# Patient Record
Sex: Female | Born: 1985 | Race: White | Hispanic: No | Marital: Single | Smoking: Former smoker
Health system: Southern US, Community
[De-identification: ages and names within clinical notes are randomized; demographics above are authoritative.]

## PROBLEM LIST (undated history)

## (undated) DIAGNOSIS — D649 Anemia, unspecified: Secondary | ICD-10-CM

## (undated) DIAGNOSIS — F419 Anxiety disorder, unspecified: Secondary | ICD-10-CM

## (undated) HISTORY — DX: Anemia, unspecified: D64.9

## (undated) HISTORY — PX: OTHER SURGICAL HISTORY: SHX169

---

## 2005-10-16 ENCOUNTER — Other Ambulatory Visit: Admission: RE | Admit: 2005-10-16 | Discharge: 2005-10-16 | Payer: Self-pay | Admitting: Gynecology

## 2007-04-23 ENCOUNTER — Emergency Department (HOSPITAL_COMMUNITY): Admission: EM | Admit: 2007-04-23 | Discharge: 2007-04-23 | Payer: Self-pay | Admitting: Family Medicine

## 2009-03-16 ENCOUNTER — Ambulatory Visit: Payer: Self-pay | Admitting: Diagnostic Radiology

## 2009-03-16 ENCOUNTER — Encounter: Payer: Self-pay | Admitting: Emergency Medicine

## 2009-03-17 ENCOUNTER — Ambulatory Visit: Payer: Self-pay | Admitting: Gastroenterology

## 2009-03-17 ENCOUNTER — Inpatient Hospital Stay (HOSPITAL_COMMUNITY): Admission: EM | Admit: 2009-03-17 | Discharge: 2009-03-27 | Payer: Self-pay | Admitting: Internal Medicine

## 2009-03-17 ENCOUNTER — Encounter: Payer: Self-pay | Admitting: Internal Medicine

## 2009-03-19 ENCOUNTER — Encounter: Payer: Self-pay | Admitting: Gastroenterology

## 2009-03-22 ENCOUNTER — Encounter (INDEPENDENT_AMBULATORY_CARE_PROVIDER_SITE_OTHER): Payer: Self-pay | Admitting: Surgery

## 2009-08-06 ENCOUNTER — Other Ambulatory Visit: Admission: RE | Admit: 2009-08-06 | Discharge: 2009-08-06 | Payer: Self-pay | Admitting: Gynecology

## 2009-08-06 ENCOUNTER — Ambulatory Visit: Payer: Self-pay | Admitting: Women's Health

## 2009-09-15 ENCOUNTER — Emergency Department (HOSPITAL_BASED_OUTPATIENT_CLINIC_OR_DEPARTMENT_OTHER): Admission: EM | Admit: 2009-09-15 | Discharge: 2009-09-15 | Payer: Self-pay | Admitting: Emergency Medicine

## 2009-09-16 ENCOUNTER — Observation Stay (HOSPITAL_COMMUNITY): Admission: EM | Admit: 2009-09-16 | Discharge: 2009-09-17 | Payer: Self-pay | Admitting: Emergency Medicine

## 2010-08-12 ENCOUNTER — Encounter (INDEPENDENT_AMBULATORY_CARE_PROVIDER_SITE_OTHER): Payer: 59 | Admitting: Women's Health

## 2010-08-12 ENCOUNTER — Other Ambulatory Visit (HOSPITAL_COMMUNITY)
Admission: RE | Admit: 2010-08-12 | Discharge: 2010-08-12 | Disposition: A | Discharge status: Home / Self Care | Payer: 59 | Source: Ambulatory Visit | Attending: Gynecology | Admitting: Gynecology

## 2010-08-12 ENCOUNTER — Other Ambulatory Visit: Payer: Self-pay | Admitting: Women's Health

## 2010-08-12 DIAGNOSIS — R82998 Other abnormal findings in urine: Secondary | ICD-10-CM

## 2010-08-12 DIAGNOSIS — B373 Candidiasis of vulva and vagina: Secondary | ICD-10-CM

## 2010-08-12 DIAGNOSIS — Z113 Encounter for screening for infections with a predominantly sexual mode of transmission: Secondary | ICD-10-CM | POA: Insufficient documentation

## 2010-08-12 DIAGNOSIS — Z01419 Encounter for gynecological examination (general) (routine) without abnormal findings: Secondary | ICD-10-CM

## 2010-08-12 DIAGNOSIS — Z124 Encounter for screening for malignant neoplasm of cervix: Secondary | ICD-10-CM | POA: Insufficient documentation

## 2010-08-13 ENCOUNTER — Emergency Department (HOSPITAL_COMMUNITY)
Admission: EM | Admit: 2010-08-13 | Discharge: 2010-08-14 | Disposition: A | Discharge status: Home / Self Care | Payer: 59 | Attending: Emergency Medicine | Admitting: Emergency Medicine

## 2010-08-13 ENCOUNTER — Emergency Department (HOSPITAL_COMMUNITY): Payer: 59

## 2010-08-13 DIAGNOSIS — R10819 Abdominal tenderness, unspecified site: Secondary | ICD-10-CM | POA: Insufficient documentation

## 2010-08-13 DIAGNOSIS — R141 Gas pain: Secondary | ICD-10-CM | POA: Insufficient documentation

## 2010-08-13 DIAGNOSIS — R109 Unspecified abdominal pain: Secondary | ICD-10-CM | POA: Insufficient documentation

## 2010-08-13 DIAGNOSIS — R110 Nausea: Secondary | ICD-10-CM | POA: Insufficient documentation

## 2010-08-13 DIAGNOSIS — R142 Eructation: Secondary | ICD-10-CM | POA: Insufficient documentation

## 2010-08-13 DIAGNOSIS — K5909 Other constipation: Secondary | ICD-10-CM | POA: Insufficient documentation

## 2010-08-13 LAB — URINALYSIS, ROUTINE W REFLEX MICROSCOPIC
Protein, ur: NEGATIVE mg/dL
Urobilinogen, UA: 1 mg/dL (ref 0.0–1.0)

## 2010-08-13 LAB — URINE MICROSCOPIC-ADD ON

## 2010-08-13 LAB — COMPREHENSIVE METABOLIC PANEL
AST: 15 U/L (ref 0–37)
Albumin: 3.9 g/dL (ref 3.5–5.2)
CO2: 25 mEq/L (ref 19–32)
Creatinine, Ser: 0.69 mg/dL (ref 0.4–1.2)
GFR calc Af Amer: >60 mL/min (ref >60)
Glucose, Bld: 96 mg/dL (ref 70–99)
Sodium: 139 mEq/L (ref 135–145)
Total Bilirubin: 0.5 mg/dL (ref 0.3–1.2)
Total Protein: 6.7 g/dL (ref 6.0–8.3)

## 2010-08-13 LAB — POCT PREGNANCY, URINE: Preg Test, Ur: NEGATIVE

## 2010-08-13 LAB — CBC
HCT: 34 % — ABNORMAL LOW (ref 36.0–46.0)
MCV: 89.5 fL (ref 78.0–100.0)
RDW: 13.1 % (ref 11.5–15.5)
WBC: 8.6 10*3/uL (ref 4.0–10.5)

## 2010-08-13 LAB — DIFFERENTIAL
Basophils Absolute: 0.1 10*3/uL (ref 0.0–0.1)
Eosinophils Relative: 1 % (ref 0–5)
Monocytes Absolute: 0.9 10*3/uL (ref 0.1–1.0)
Monocytes Relative: 11 % (ref 3–12)
Neutrophils Relative %: 48 % (ref 43–77)

## 2010-08-13 LAB — LACTIC ACID, PLASMA: Lactic Acid, Venous: 0.6 mmol/L (ref 0.5–2.2)

## 2010-08-14 ENCOUNTER — Encounter (INDEPENDENT_AMBULATORY_CARE_PROVIDER_SITE_OTHER): Payer: Self-pay | Admitting: *Deleted

## 2010-08-22 NOTE — Letter (Signed)
Summary: New Patient letter  [facility]  [address]   [place], [place] [address]   Phone: [phone]  Fax: [phone]       08/14/2010 MRN: [id number]  [facility] [address] [place], [place]  [address]  [place]  Dear Ms. [facility],  Welcome to the Gastroenterology Division at [facility].    You are scheduled to see Dr.  Arlyce Dice on 09/23/2010 at 3:00 on the 3rd floor at [facility], [address]  We ask that you try to arrive at our office 15 minutes prior to your appointment time to allow for check-in.  We would like you to complete the enclosed self-administered evaluation form prior to your visit and bring it with you on the day of your appointment.  We will review it with you.  Also, please bring a complete list of all your medications or, if you prefer, bring the medication bottles and we will list them.  Please bring your insurance card so that we may make a copy of it.  If your insurance requires a referral to see a specialist, please bring your referral form from your primary care physician.  Co-payments are due at the time of your visit and may be paid by cash, check or credit card.     Your office visit will consist of a consult with your physician (includes a physical exam), any laboratory testing he/she may order, scheduling of any necessary diagnostic testing (e.g. x-ray, ultrasound, CT-scan), and scheduling of a procedure (e.g. Endoscopy, Colonoscopy) if required.  Please allow enough time on your schedule to allow for any/all of these possibilities.    If you cannot keep your appointment, please call [phone] to cancel or reschedule prior to your appointment date.  This allows [place] the opportunity to schedule an appointment for another patient in need of care.  If you do not cancel or reschedule by 5 p.m. the business day prior to your appointment date, you will be charged a $50.00 late cancellation/no-show fee.    Thank you for  choosing [facility] for your medical needs.  We appreciate the opportunity to care for you.  Please visit [place] at our website  to learn more about our practice.                     Sincerely,                                                             The Gastroenterology Division

## 2010-09-04 LAB — CBC
HCT: 28.1 % — ABNORMAL LOW (ref 36.0–46.0)
HCT: 29.5 % — ABNORMAL LOW (ref 36.0–46.0)
Hemoglobin: 10.2 g/dL — ABNORMAL LOW (ref 12.0–15.0)
Hemoglobin: 9.4 g/dL — ABNORMAL LOW (ref 12.0–15.0)
MCV: 90.2 fL (ref 78.0–100.0)
MCV: 90.4 fL (ref 78.0–100.0)
Platelets: 128 10*3/uL — ABNORMAL LOW (ref 150–400)
Platelets: 128 10*3/uL — ABNORMAL LOW (ref 150–400)
RBC: 3.13 MIL/uL — ABNORMAL LOW (ref 3.87–5.11)
RBC: 3.26 MIL/uL — ABNORMAL LOW (ref 3.87–5.11)
WBC: 14.5 10*3/uL — ABNORMAL HIGH (ref 4.0–10.5)
WBC: 7.5 10*3/uL (ref 4.0–10.5)
WBC: 8.1 10*3/uL (ref 4.0–10.5)

## 2010-09-04 LAB — DIFFERENTIAL
Basophils Absolute: 0 10*3/uL (ref 0.0–0.1)
Basophils Relative: 0 % (ref 0–1)
Basophils Relative: 0 % (ref 0–1)
Eosinophils Absolute: 0 10*3/uL (ref 0.0–0.7)
Eosinophils Relative: 0 % (ref 0–5)
Lymphs Abs: 0.3 10*3/uL — ABNORMAL LOW (ref 0.7–4.0)
Monocytes Absolute: 0.2 10*3/uL (ref 0.1–1.0)
Monocytes Relative: 2 % — ABNORMAL LOW (ref 3–12)
Monocytes Relative: 4 % (ref 3–12)
Neutro Abs: 7.8 10*3/uL — ABNORMAL HIGH (ref 1.7–7.7)
Neutrophils Relative %: 96 % — ABNORMAL HIGH (ref 43–77)

## 2010-09-04 LAB — BASIC METABOLIC PANEL
BUN: 11 mg/dL (ref 6–23)
BUN: 19 mg/dL (ref 6–23)
Calcium: 7.2 mg/dL — ABNORMAL LOW (ref 8.4–10.5)
Chloride: 105 mEq/L (ref 96–112)
Chloride: 112 mEq/L (ref 96–112)
Creatinine, Ser: 0.61 mg/dL (ref 0.4–1.2)
GFR calc Af Amer: >60 mL/min (ref >60)
GFR calc Af Amer: >60 mL/min (ref >60)
GFR calc non Af Amer: >60 mL/min (ref >60)
GFR calc non Af Amer: >60 mL/min (ref >60)
GFR calc non Af Amer: >60 mL/min (ref >60)
Potassium: 3.1 mEq/L — ABNORMAL LOW (ref 3.5–5.1)
Sodium: 131 mEq/L — ABNORMAL LOW (ref 135–145)
Sodium: 137 mEq/L (ref 135–145)

## 2010-09-04 LAB — COMPREHENSIVE METABOLIC PANEL
ALT: 41 U/L — ABNORMAL HIGH (ref 0–35)
Albumin: 2.8 g/dL — ABNORMAL LOW (ref 3.5–5.2)
Alkaline Phosphatase: 90 U/L (ref 39–117)
BUN: 27 mg/dL — ABNORMAL HIGH (ref 6–23)
Chloride: 109 mEq/L (ref 96–112)
Glucose, Bld: 110 mg/dL — ABNORMAL HIGH (ref 70–99)
Potassium: 3.2 mEq/L — ABNORMAL LOW (ref 3.5–5.1)
Sodium: 141 mEq/L (ref 135–145)
Total Bilirubin: 0.9 mg/dL (ref 0.3–1.2)
Total Protein: 6 g/dL (ref 6.0–8.3)

## 2010-09-04 LAB — URINALYSIS, ROUTINE W REFLEX MICROSCOPIC
Glucose, UA: NEGATIVE mg/dL
Ketones, ur: 15 mg/dL — AB
Nitrite: NEGATIVE
Protein, ur: 100 mg/dL — AB
Protein, ur: 30 mg/dL — AB
Specific Gravity, Urine: 1.023 (ref 1.005–1.030)
Urobilinogen, UA: 2 mg/dL — ABNORMAL HIGH (ref 0.0–1.0)
Urobilinogen, UA: 4 mg/dL — ABNORMAL HIGH (ref 0.0–1.0)

## 2010-09-04 LAB — STREP A DNA PROBE: Group A Strep Probe: NEGATIVE

## 2010-09-04 LAB — MRSA PCR SCREENING: MRSA by PCR: NEGATIVE

## 2010-09-04 LAB — HEPATIC FUNCTION PANEL
Alkaline Phosphatase: 105 U/L (ref 39–117)
Bilirubin, Direct: 0.5 mg/dL — ABNORMAL HIGH (ref 0.0–0.3)
Indirect Bilirubin: 0.4 mg/dL (ref 0.3–0.9)
Total Bilirubin: 0.9 mg/dL (ref 0.3–1.2)
Total Protein: 6.3 g/dL (ref 6.0–8.3)

## 2010-09-04 LAB — STOOL CULTURE

## 2010-09-04 LAB — AMYLASE: Amylase: 18 U/L (ref 0–105)

## 2010-09-04 LAB — CULTURE, BLOOD (ROUTINE X 2): Culture: NO GROWTH

## 2010-09-04 LAB — KETONES, QUALITATIVE

## 2010-09-04 LAB — LACTIC ACID, PLASMA: Lactic Acid, Venous: 1.4 mmol/L (ref 0.5–2.2)

## 2010-09-04 LAB — CARDIAC PANEL(CRET KIN+CKTOT+MB+TROPI)
CK, MB: 0.4 ng/mL (ref 0.3–4.0)
Total CK: 84 U/L (ref 7–177)

## 2010-09-04 LAB — URINE CULTURE

## 2010-09-04 LAB — URINE MICROSCOPIC-ADD ON

## 2010-09-04 LAB — EPSTEIN-BARR VIRUS VCA ANTIBODY PANEL: EBV NA IgG: 0.46 {ISR}

## 2010-09-19 LAB — CBC
HCT: 30.3 % — ABNORMAL LOW (ref 36.0–46.0)
HCT: 31.2 % — ABNORMAL LOW (ref 36.0–46.0)
HCT: 31.9 % — ABNORMAL LOW (ref 36.0–46.0)
Hemoglobin: 10.3 g/dL — ABNORMAL LOW (ref 12.0–15.0)
MCV: 90.7 fL (ref 78.0–100.0)
MCV: 91.1 fL (ref 78.0–100.0)
Platelets: 177 10*3/uL (ref 150–400)
Platelets: 189 10*3/uL (ref 150–400)
Platelets: 198 10*3/uL (ref 150–400)
Platelets: 218 10*3/uL (ref 150–400)
RBC: 3.42 MIL/uL — ABNORMAL LOW (ref 3.87–5.11)
RBC: 3.81 MIL/uL — ABNORMAL LOW (ref 3.87–5.11)
RDW: 13.7 % (ref 11.5–15.5)
RDW: 14.4 % (ref 11.5–15.5)
WBC: 10.9 10*3/uL — ABNORMAL HIGH (ref 4.0–10.5)
WBC: 6.6 10*3/uL (ref 4.0–10.5)
WBC: 7 10*3/uL (ref 4.0–10.5)
WBC: 8.1 10*3/uL (ref 4.0–10.5)
WBC: 8.4 10*3/uL (ref 4.0–10.5)

## 2010-09-19 LAB — URINALYSIS, ROUTINE W REFLEX MICROSCOPIC
Glucose, UA: NEGATIVE mg/dL
Specific Gravity, Urine: 1.012 (ref 1.005–1.030)
pH: 6 (ref 5.0–8.0)

## 2010-09-19 LAB — BASIC METABOLIC PANEL
BUN: 2 mg/dL — ABNORMAL LOW (ref 6–23)
BUN: 3 mg/dL — ABNORMAL LOW (ref 6–23)
BUN: 3 mg/dL — ABNORMAL LOW (ref 6–23)
BUN: 7 mg/dL (ref 6–23)
BUN: <1 mg/dL — ABNORMAL LOW (ref 6–23)
CO2: 22 mEq/L (ref 19–32)
CO2: 26 mEq/L (ref 19–32)
Calcium: 8.5 mg/dL (ref 8.4–10.5)
Calcium: 8.9 mg/dL (ref 8.4–10.5)
Chloride: 102 mEq/L (ref 96–112)
Chloride: 103 mEq/L (ref 96–112)
Chloride: 107 mEq/L (ref 96–112)
Creatinine, Ser: 0.5 mg/dL (ref 0.4–1.2)
Creatinine, Ser: 0.63 mg/dL (ref 0.4–1.2)
Creatinine, Ser: 0.63 mg/dL (ref 0.4–1.2)
Creatinine, Ser: 0.69 mg/dL (ref 0.4–1.2)
Creatinine, Ser: 0.7 mg/dL (ref 0.4–1.2)
GFR calc Af Amer: >60 mL/min (ref >60)
GFR calc Af Amer: >60 mL/min (ref >60)
GFR calc non Af Amer: >60 mL/min (ref >60)
GFR calc non Af Amer: >60 mL/min (ref >60)
GFR calc non Af Amer: >60 mL/min (ref >60)
GFR calc non Af Amer: >60 mL/min (ref >60)
Glucose, Bld: 206 mg/dL — ABNORMAL HIGH (ref 70–99)
Glucose, Bld: 67 mg/dL — ABNORMAL LOW (ref 70–99)
Glucose, Bld: 91 mg/dL (ref 70–99)
Potassium: 3.4 mEq/L — ABNORMAL LOW (ref 3.5–5.1)
Potassium: 3.8 mEq/L (ref 3.5–5.1)
Potassium: 4.1 mEq/L (ref 3.5–5.1)
Sodium: 137 mEq/L (ref 135–145)
Sodium: 137 mEq/L (ref 135–145)

## 2010-09-19 LAB — COMPREHENSIVE METABOLIC PANEL
ALT: 12 U/L (ref 0–35)
Albumin: 3.2 g/dL — ABNORMAL LOW (ref 3.5–5.2)
Alkaline Phosphatase: 36 U/L — ABNORMAL LOW (ref 39–117)
BUN: 6 mg/dL (ref 6–23)
Chloride: 107 mEq/L (ref 96–112)
Potassium: 3.7 mEq/L (ref 3.5–5.1)
Sodium: 136 mEq/L (ref 135–145)
Total Bilirubin: 0.7 mg/dL (ref 0.3–1.2)
Total Protein: 5.7 g/dL — ABNORMAL LOW (ref 6.0–8.3)

## 2010-09-19 LAB — DIFFERENTIAL
Eosinophils Absolute: 0.2 10*3/uL (ref 0.0–0.7)
Lymphocytes Relative: 32 % (ref 12–46)
Lymphs Abs: 2.6 10*3/uL (ref 0.7–4.0)
Monocytes Relative: 9 % (ref 3–12)
Neutro Abs: 4.5 10*3/uL (ref 1.7–7.7)
Neutrophils Relative %: 55 % (ref 43–77)

## 2010-09-19 LAB — URINE MICROSCOPIC-ADD ON

## 2010-09-23 ENCOUNTER — Encounter: Payer: Self-pay | Admitting: Gastroenterology

## 2010-09-23 ENCOUNTER — Ambulatory Visit (INDEPENDENT_AMBULATORY_CARE_PROVIDER_SITE_OTHER): Payer: 59 | Admitting: Gastroenterology

## 2010-09-23 DIAGNOSIS — I8390 Asymptomatic varicose veins of unspecified lower extremity: Secondary | ICD-10-CM | POA: Insufficient documentation

## 2010-09-23 DIAGNOSIS — K5900 Constipation, unspecified: Secondary | ICD-10-CM

## 2010-09-23 MED ORDER — PEG-KCL-NACL-NASULF-NA ASC-C 100 G PO SOLR: 1.0000 | Freq: Once | ORAL | Status: AC

## 2010-09-23 NOTE — Assessment & Plan Note (Signed)
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  Indications #1 referred to vascular surgery for further evaluation

## 2010-09-23 NOTE — Progress Notes (Signed)
History of Present Illness:  Lindsay Duran is a 25 year old white female referred from the ER following evaluation for abdominal pain and constipation. In October, 2010 she presented with a sigmoid volvulus. This was resected. 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 She has done well until the last month when she developed constipation. With worsening constipation she developed severe lower bowel pain and bloating. With bowel stimulants she is able to move her bowels and feels that she requires stimulants for a bowel movement. There is no history of melena or hematochezia.  Constipation continues.    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 All other review of systems were otherwise negative.    Current Medications, Allergies, Past Medical History, Past Surgical History, Family History and Social History were reviewed in [employer] electronic medical record  Vital signs were reviewed in today's medical record. Physical Exam: General: Well developed , well nourished, no acute distress Head: Normocephalic and atraumatic Eyes:  sclerae anicteric, EOMI Ears: Normal auditory acuity Mouth: No deformity or lesions Lungs: Clear throughout to auscultation Heart: Regular rate and rhythm; no murmurs, rubs or bruits Abdomen: Soft,non distended. No masses, hepatosplenomegaly or hernias noted. Normal Bowel sounds. There is mild tenderness in the right upper quadrant Rectal:deferred Musculoskeletal: Symmetrical with no gross deformities  Pulses:  Normal pulses noted Extremities: No clubbing, cyanosis, edema or deformities varicose veins are noted on each lower extremity. There is a prominent varicose vein extending to the thigh and the right lower extremity Neurological: Alert oriented x 4, grossly  nonfocal Psychological:  Alert and cooperative. Normal mood and affect

## 2010-09-23 NOTE — Assessment & Plan Note (Addendum)
Constipation is most likely functional. Congenital abnormalities of the colon, including strictures and postoperative stricture should be ruled out.  Recommendations #1 colonoscopy  Risks, alternatives, and complications of the procedure, including bleeding, perforation, and possible need for surgery, were explained to the patient.  Patient's questions were answered.

## 2010-09-23 NOTE — Patient Instructions (Signed)
Your Colonoscopy is scheduled on 09/24/2010 at 11am [facility] office will look at your records and schedule you an appointment there office number is [phone] in case you need to contact them  Colonoscopy A colonoscopy is an exam to evaluate your entire colon. In this exam, your colon is cleansed. A long fiberoptic tube is inserted through your rectum and into your colon. The fiberoptic scope (endoscope) is a long bundle of enclosed and very flexible fibers. These fibers transmit light to the area examined and send images from that area to your caregiver. Discomfort is usually minimal. You may be given a drug to help you sleep (sedative) during or prior to the procedure. This exam helps to detect lumps (tumors), polyps, inflammation, and areas of bleeding. Your caregiver may also take a small piece of tissue (biopsy) that will be examined under a microscope. BEFORE THE PROCEDURE  A clear liquid diet may be required for 2 days before the exam.   Liquid injections (enemas) or laxatives may be required.   A large amount of electrolyte solution may be given to you to drink over a short period of time. This solution is used to clean out your colon.   You should be present 1 prior to your procedure or as directed by your caregiver.   Check in at the admissions desk to fill out necessary forms if not preregistered. There will be consent forms to sign prior to the procedure. If accompanied by friends or family, there is a waiting area for them while you are having your procedure.  LET YOUR CAREGIVER KNOW ABOUT:  Allergies to food or medicine.  Medicines taken, including vitamins, herbs, eyedrops, over-the-counter medicines, and creams.   Use of steroids (by mouth or creams).   Previous problems with anesthetics or numbing medicines.   History of bleeding problems or blood clots.  Previous surgery.   Other health problems, including diabetes and kidney problems.   Possibility of pregnancy, if  this applies.   AFTER THE PROCEDURE  If you received a sedative and/or pain medicine, you will need to arrange for someone to drive you home.   Occasionally, there is a little blood passed with the first bowel movement. DO NOT be concerned.  HOME CARE INSTRUCTIONS  It is not unusual to pass moderate amounts of gas and experience mild abdominal cramping following the procedure. This is due to air being used to inflate your colon during the exam. Walking or a warm pack on your belly (abdomen) may help.   You may resume all normal meals and activities after sedatives and medicines have worn off.   Only take over-the-counter or prescription medicines for pain, discomfort, or fever as directed by your caregiver. DO NOT use aspirin or blood thinners if a biopsy was taken. Consult your caregiver for medicine usage if biopsies were taken.  FINDING OUT THE RESULTS OF YOUR TEST Not all test results are available during your visit. If your test results are not back during the visit, make an appointment with your caregiver to find out the results. Do not assume everything is normal if you have not heard from your caregiver or the medical facility. It is important for you to follow up on all of your test results. SEEK IMMEDIATE MEDICAL CARE IF:  You pass large blood clots or fill a toilet with blood following the procedure. This may also occur 10 to 14 days following the procedure. This is more likely if a biopsy was taken.  You develop abdominal pain that keeps getting worse and cannot be relieved with medicine.  Document Released: 05/30/2000 Document Re-Released: 08/27/2009 [facility] Patient Information 2011 [place], [place].

## 2010-09-24 ENCOUNTER — Encounter: Payer: Self-pay | Admitting: *Deleted

## 2010-09-24 ENCOUNTER — Encounter: Payer: Self-pay | Admitting: Gastroenterology

## 2010-09-24 ENCOUNTER — Ambulatory Visit: Payer: 59 | Admitting: Gastroenterology

## 2010-09-24 VITALS — Temp 98.7°F | Ht 69.0 in | Wt 170.0 lb

## 2010-09-24 MED ORDER — DISPOSABLE ENEMA 19-7 GM/118ML RE ENEM: 1.0000 | ENEMA | Freq: Once | RECTAL | Status: DC

## 2010-09-24 MED ORDER — BISACODYL 5 MG PO TBEC: DELAYED_RELEASE_TABLET | ORAL | Status: DC

## 2010-09-24 MED ORDER — METOCLOPRAMIDE HCL 10 MG PO TABS: ORAL_TABLET | ORAL | Status: DC

## 2010-09-24 MED ORDER — SODIUM CHLORIDE 0.9 % IV SOLN: 500.0000 mL | INTRAVENOUS | Status: DC

## 2010-09-24 MED ORDER — DISPOSABLE ENEMA 19-7 GM/118ML RE ENEM: 1.0000 | ENEMA | Freq: Once | RECTAL | Status: AC

## 2010-09-24 MED ORDER — POLYETHYLENE GLYCOL 3350 17 GM/SCOOP PO POWD: ORAL | Status: DC

## 2010-09-24 NOTE — Progress Notes (Signed)
Not ment to send to pharmacy

## 2010-09-24 NOTE — Progress Notes (Signed)
Pt given fleets enema X 2.  Results continue to be solid brown stool.  Pt. To be rescheduled per Dr. Arlyce Dice.  Pt. Rescheduled for 10/01/10 at 8. Instructions reviewed with pt.

## 2010-09-24 NOTE — Patient Instructions (Signed)
Return on 10/01/10 at 7:30 for colonoscopy.

## 2010-09-26 ENCOUNTER — Telehealth: Payer: Self-pay | Admitting: Gastroenterology

## 2010-09-26 NOTE — Telephone Encounter (Signed)
FYI DR Arlyce Dice

## 2010-09-30 ENCOUNTER — Telehealth: Payer: Self-pay | Admitting: Gastroenterology

## 2010-09-30 NOTE — Telephone Encounter (Signed)
yes

## 2010-10-01 ENCOUNTER — Other Ambulatory Visit: Payer: 59 | Admitting: Gastroenterology

## 2010-10-16 ENCOUNTER — Encounter: Payer: 59 | Admitting: Vascular Surgery

## 2010-10-31 ENCOUNTER — Ambulatory Visit (AMBULATORY_SURGERY_CENTER): Payer: 59 | Admitting: Gastroenterology

## 2010-10-31 ENCOUNTER — Encounter: Payer: Self-pay | Admitting: Gastroenterology

## 2010-10-31 VITALS — BP 105/60 | HR 60 | Temp 98.9°F | Resp 18 | Ht 69.0 in | Wt 165.0 lb

## 2010-10-31 DIAGNOSIS — K5900 Constipation, unspecified: Secondary | ICD-10-CM

## 2010-10-31 DIAGNOSIS — I8390 Asymptomatic varicose veins of unspecified lower extremity: Secondary | ICD-10-CM

## 2010-10-31 MED ORDER — LUBIPROSTONE 24 MCG PO CAPS: 24.0000 ug | ORAL_CAPSULE | Freq: Two times a day (BID) | ORAL | Status: AC

## 2010-10-31 MED ORDER — SODIUM CHLORIDE 0.9 % IV SOLN: 500.0000 mL | INTRAVENOUS | Status: AC

## 2010-10-31 NOTE — Patient Instructions (Signed)
Please read the handouts given to you by your recovery room nurse.    Please resume your routine medicines, and Dr. Arlyce Dice has ordered  A trial of amitiza for you.  Take it as ordered and he wants to see you in the office in 1 month.  Please call tomorrow for that appointment as his slots are booked very quickly.  Thank-you.

## 2010-11-01 ENCOUNTER — Telehealth: Payer: Self-pay

## 2010-11-01 NOTE — Telephone Encounter (Signed)
No answer. No ID on answering machine. 

## 2010-11-06 ENCOUNTER — Encounter: Payer: 59 | Admitting: Vascular Surgery

## 2010-11-13 ENCOUNTER — Encounter: Payer: 59 | Admitting: Vascular Surgery

## 2010-12-11 ENCOUNTER — Encounter: Payer: Self-pay | Admitting: Gastroenterology

## 2010-12-11 ENCOUNTER — Ambulatory Visit (INDEPENDENT_AMBULATORY_CARE_PROVIDER_SITE_OTHER): Payer: 59 | Admitting: Gastroenterology

## 2010-12-11 DIAGNOSIS — K5900 Constipation, unspecified: Secondary | ICD-10-CM

## 2010-12-11 DIAGNOSIS — I8390 Asymptomatic varicose veins of unspecified lower extremity: Secondary | ICD-10-CM

## 2010-12-11 MED ORDER — LUBIPROSTONE 24 MCG PO CAPS: 24.0000 ug | ORAL_CAPSULE | Freq: Two times a day (BID) | ORAL | Status: DC

## 2010-12-11 NOTE — Patient Instructions (Signed)
We have sent in your Amitiza rx to your pharmacy today

## 2010-12-11 NOTE — Assessment & Plan Note (Signed)
Follow up with vascular surgery.

## 2010-12-11 NOTE — Assessment & Plan Note (Addendum)
Excellent response to amitiza.  Plan to continue with the same 

## 2010-12-11 NOTE — Progress Notes (Signed)
History of Present Illness:  Lindsay Duran has returned for  followup of constipation. On a regimen of amitiza  24 mcg twice a day she is doing quite well. She is moving her bowels regularly. She has no other complaints.    Review of Systems: Pertinent positive and negative review of systems were noted in the above HPI section. All other review of systems were otherwise negative.    Current Medications, Allergies, Past Medical History, Past Surgical History, Family History and Social History were reviewed in [employer] electronic medical record  Vital signs were reviewed in today's medical record. Physical Exam: General: Well developed , well nourished, no acute distress  Extremities: No clubbing, cyanosis, edema or deformities noted Neurological: Alert oriented x 4, grossly nonfocal Psychological:  Alert and cooperative. Normal mood and affect

## 2011-02-04 ENCOUNTER — Encounter: Payer: Self-pay | Admitting: Vascular Surgery

## 2011-02-10 ENCOUNTER — Ambulatory Visit (INDEPENDENT_AMBULATORY_CARE_PROVIDER_SITE_OTHER): Payer: 59 | Admitting: Vascular Surgery

## 2011-02-10 VITALS — BP 101/58 | HR 93 | Resp 20 | Ht 70.0 in | Wt 167.0 lb

## 2011-02-10 DIAGNOSIS — I83893 Varicose veins of bilateral lower extremities with other complications: Secondary | ICD-10-CM

## 2011-02-10 NOTE — Progress Notes (Signed)
Subjective:     Patient ID: Lindsay Duran, female   DOB: Dec 30, 1985, 25 y.o.   MRN: [id number]  HPI this healthy 25 year old female was referred for bilateral lower extremity varicose veins. She states that for the last several years she has been having prominent veins developed in both lower extremities right equal to left. She will have some mild swelling in the ankles as the day progresses. He denies any severe pain in these areas but they are becoming significantly more prominent. She has no history of DVT or thrombophlebitis stasis ulcers or bleeding. She does not wear elastic compression stockings. 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 I have reviewed the CT scan with contrast today. The venous return of both lower extremities is through a large left ovarian vein which communicates to the left renal vein into the inferior vena cava above this level .  Past Medical History  Diagnosis Date  . Anemia     History  Substance Use Topics  . Smoking status: Current Some Day Smoker    Types: Cigarettes  . Smokeless tobacco: Never Used   Comment: 1 cig. occasionally  . Alcohol Use: 1.2 oz/week    2 Glasses of wine per week     occasionally    Family History  Problem Relation Age of Onset  . Lung cancer Maternal Grandfather   . Colon cancer Maternal Grandmother   . Prostate cancer Paternal Grandfather     No Known Allergies  Current outpatient prescriptions:lubiprostone (AMITIZA) 24 MCG capsule, Take 1 capsule (24 mcg total) by mouth 2 (two) times daily with a meal., Disp: 60 capsule, Rfl: 3 Current facility-administered medications:0.9 %  sodium chloride infusion, 500 mL, Intravenous, Continuous, Louis Meckel, MD  BP 101/58  Pulse 93  Resp 20  Ht 5\' 10"  (1.778 m)  Wt 167 lb (75.751 kg)  BMI 23.96 kg/m2  Body mass index is 23.96 kg/(m^2).          Review of Systems She  denies any chest pain dyspnea on exertion PND orthopnea lower extremity symptoms or any other systemic symptoms and a complete review of systems. She has done well following her surgery for sigmoid volvulus which required a colon resection.    Objective:   Physical Exam blood pressure 101/58 heart rate 93 respirations 20 General she is a healthy-appearing female in no apparent stress alert and oriented x3 she is in no apparent stress Chest clear to auscultation no rhonchi or wheezing Cardiovascular exam is regular rhythm no murmurs carotid pulses are 3+ with no audible bruits Abdomen is soft nontender with no masses Lower extremity exam reveals 3+ femoral popliteal posterior tibial and dorsalis pedis pulses palpable. There are bulging varicosities in both legs and the great saphenous system in the anterior thigh and medial calf area extending down to the midcalf region. There is minimal edema distally. No hyperpigmentation or ulceration is noted and no spider veins are present. 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    Plan:    schedule her for bilateral lower extremity reflux study and return to see me and discuss this further at that time .

## 2011-02-10 NOTE — Progress Notes (Signed)
New bilat. vv patient.

## 2011-04-07 ENCOUNTER — Encounter: Payer: Self-pay | Admitting: Vascular Surgery

## 2011-04-08 ENCOUNTER — Ambulatory Visit (INDEPENDENT_AMBULATORY_CARE_PROVIDER_SITE_OTHER): Payer: 59 | Admitting: Vascular Surgery

## 2011-04-08 VITALS — BP 98/48 | HR 70 | Resp 14 | Ht 70.0 in | Wt 165.0 lb

## 2011-04-08 DIAGNOSIS — R609 Edema, unspecified: Secondary | ICD-10-CM

## 2011-04-08 DIAGNOSIS — I83893 Varicose veins of bilateral lower extremities with other complications: Secondary | ICD-10-CM

## 2011-04-08 NOTE — Progress Notes (Signed)
Subjective:     Patient ID: Lindsay Duran, female   DOB: Oct 23, 1985, 25 y.o.   MRN: [id number]  HPI this 25 year old healthy female returns today for further evaluation of her venous insufficiency of both lower extremities. She has had bulging varicosities in both thigh and calf areas for the last few years which have been becoming more prominent and more symptomatic. She develops aching throbbing and burning discomfort as the day progresses. She has a congenitally absent IVC which was demonstrated on a CT scan in 2010. She has not worn elastic compression stockings nor tried elevation or ibuprofen.  Past Medical History  Diagnosis Date  . Anemia     History  Substance Use Topics  . Smoking status: Current Some Day Smoker    Types: Cigarettes  . Smokeless tobacco: Never Used   Comment: 1 cig. occasionally  . Alcohol Use: 1.2 oz/week    2 Glasses of wine per week     occasionally    Family History  Problem Relation Age of Onset  . Lung cancer Maternal Grandfather   . Colon cancer Maternal Grandmother   . Prostate cancer Paternal Grandfather     No Known Allergies  Current outpatient prescriptions:lubiprostone (AMITIZA) 24 MCG capsule, Take 1 capsule (24 mcg total) by mouth 2 (two) times daily with a meal., Disp: 60 capsule, Rfl: 3 Current facility-administered medications:0.9 %  sodium chloride infusion, 500 mL, Intravenous, Continuous, Louis Meckel, MD  There were no vitals taken for this visit.  There is no height or weight on file to calculate BMI.         Review of Systems she denies chest pain, dyspnea on exertion, PND, orthopnea, claudication symptoms. She does have a history of a colon resection for a sigmoid volvulus he denies distal edema or venous stasis ulcers    Objective:   Physical Exam blood pressure 100/60 heart rate 80 respirations 20  Generally she is well-developed well-nourished female no apparent stress alert and oriented x3 Lungs no rhonchi or  wheezing Lower extremity exam reveals reverse femoral and dorsalis pedis pulses palpable bilaterally. She has bulging varicosities in both thigh and calf regions in the distribution of the great saphenous system with no hyperpigmentation or ulceration distally  Today I ordered a venous duplex exam of both legs which are reviewed and interpreted. As the following findings. The left great saphenous vein has gross reflux throughout. There is some reflux in the anterior accessory branch on the left. The right great saphenous vein has gross reflux throughout but is not quite as large as the left. The right anterior accessory branch of the great saphenous vein is a larger vein and has gross reflux communicating with the anterior thigh varicosities. There is no DVT or reflux in the deep system     Assessment:     Severe venous insufficiency bilaterally secondary to gross reflux in both great saphenous systems.    Plan:     #1 long-leg elastic compression stockings 20-30 mm gradient #2 elevation of legs as much as possible #3 ibuprofen on a daily basis for pain #4 if no improvement I think we should proceed with #1 laser ablation left great saphenous vein with multiple stab phlebectomy #2 laser ablation of right anterior accessory branch great saphenous vein and possibly right great saphenous vein also with multiple stab phlebectomy Patient return in 3 months for further followup

## 2011-04-14 NOTE — Procedures (Unsigned)
LOWER EXTREMITY VENOUS REFLUX EXAM  INDICATION:  Varicose veins, edema.  EXAM:  Using color-flow imaging and pulse Doppler spectral analysis, the bilateral common femoral, superficial femoral, popliteal, posterior tibial, greater and lesser saphenous veins are evaluated.  There is no evidence suggesting deep venous insufficiency in the bilateral lower extremities.  The bilateral saphenofemoral junctions are competent. The bilateral GSV's are not competent with reflux of >584milliseconds with the calibers as described below. The bilateral anterior accessory branches are not competent with >500 milliseconds with the caliber as described on the worksheet.  The right proximal short saphenous vein demonstrates incompetency at the mid/distal calf segment with diameters ranging from 0.25 cm to 0.38 cm. The left proximal small saphenous vein demonstrates competency.  GSV Diameter (used if found to be incompetent only) Proximal Greater Saphenous Vein                  0.83 cm   0.87 cm Proximal-to-mid-thigh                            0.47 cm   0.54 cm Mid thigh                                        0.47 cm   0.60 cm Mid-distal thigh Distal thigh                                     0.51 cm   0.88 cm Knee                                             0.50 cm   0.52 cm       IMPRESSION: 1.   The bilateral greater saphenous veins are not competent with     reflux >579milliseconds. 2.   The bilateral anterior accessory branches are not competent with     reflux >500 milliseconds. 3.   The bilateral greater saphenous vein are not tortuous. 4.   The bilateral anterior accessory branches are tortuous. 5.   Deep venous system bilaterally is competent. 6.   The right small saphenous vein is not competent with reflux of     >569milliseconds. 7.   The left small saphenous vein is competent.  ___________________________________________ Quita Skye. Hart [place], M.D.  SH/MEDQ  D:   04/08/2011  T:  04/08/2011  Job:  [id number]

## 2011-05-01 IMAGING — CT CT NECK W/ CM
3 of 4 series · 10 of 20 positions shown, 12 images · IV contrast (75ml omni 300)
Comparison: None.

CLINICAL DATA: 23-year-old female with tonsillitis, neck pain,
fever.  Possible abscess.

CT NECK WITH CONTRAST
TECHNIQUE: Multidetector CT imaging of the neck was performed with
intravenous contrast.
Contrast: 75 ml [id number].

[Series 2: 2cc/30ml and 1cc/45ml · axial · 0.49mm/px · z∈[-278,-108]mm · 5 of 103 slices shown, 7 images]
[im 18/103  soft-tissue]
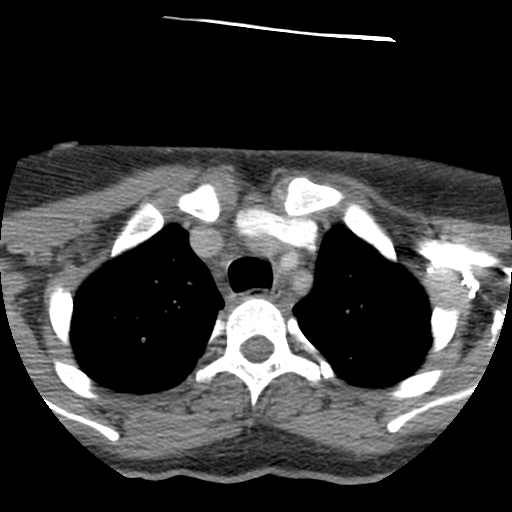
[im 18/103  bone]
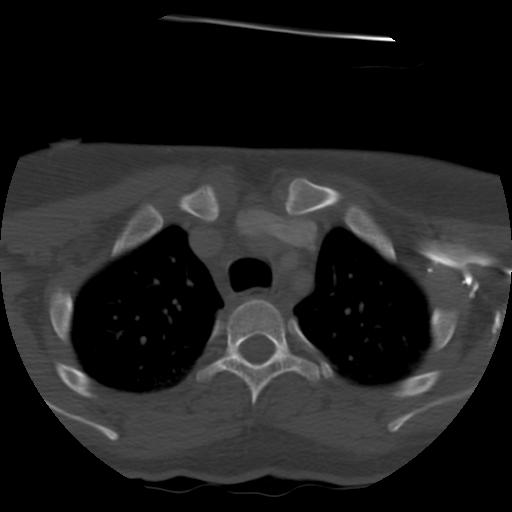
[im 35/103  bone]
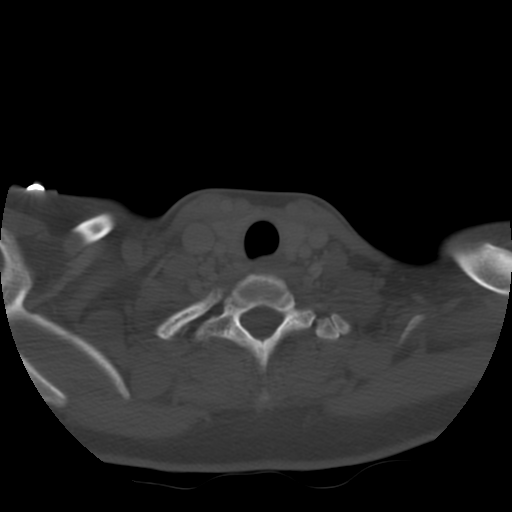
[im 52/103  bone]
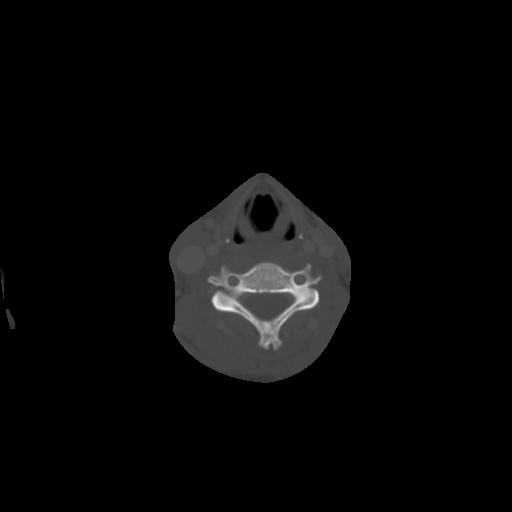
[im 69/103  bone]
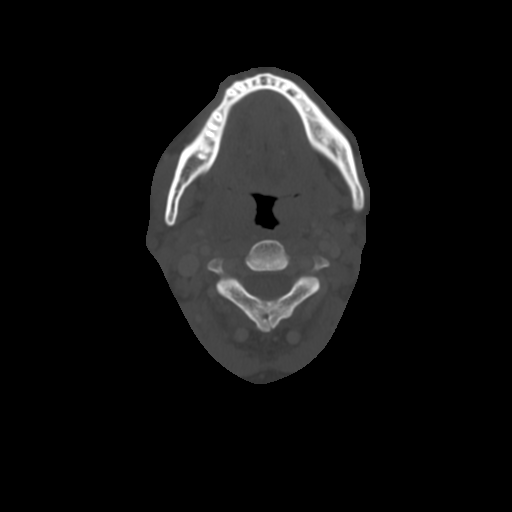
[im 86/103  soft-tissue]
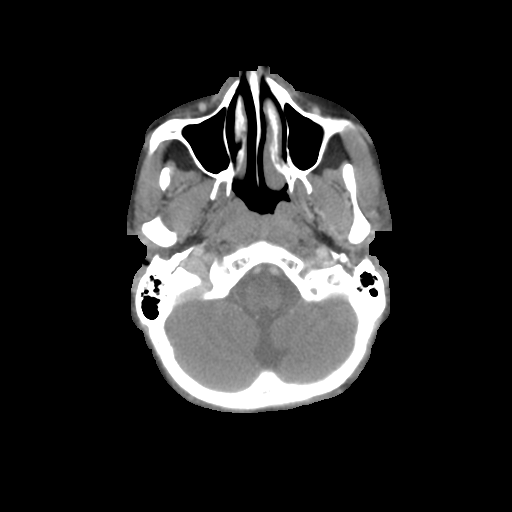
[im 86/103  bone]
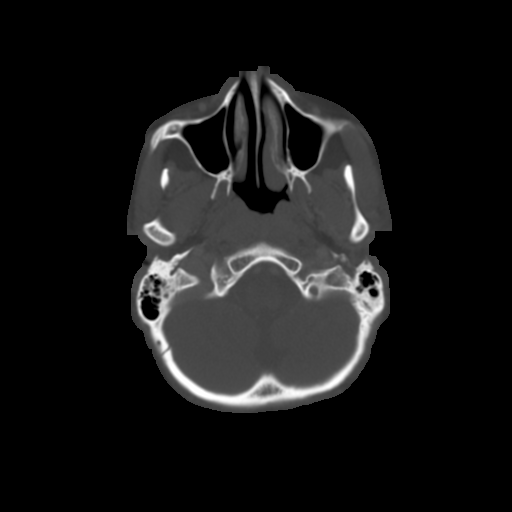

[Series 300: cor · coronal · 0.51mm/px · 3 of 98 slices shown]
[im 20/98  bone]
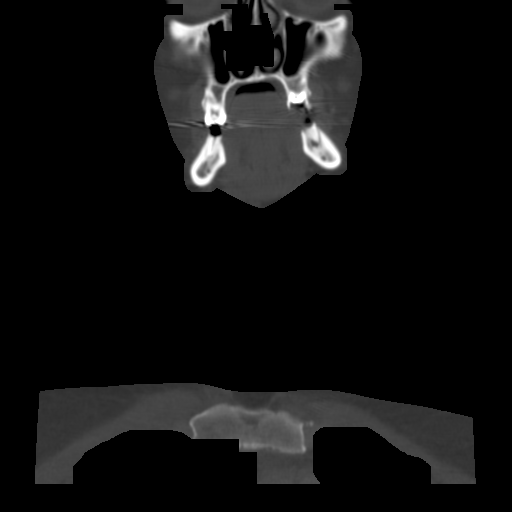
[im 39/98  bone]
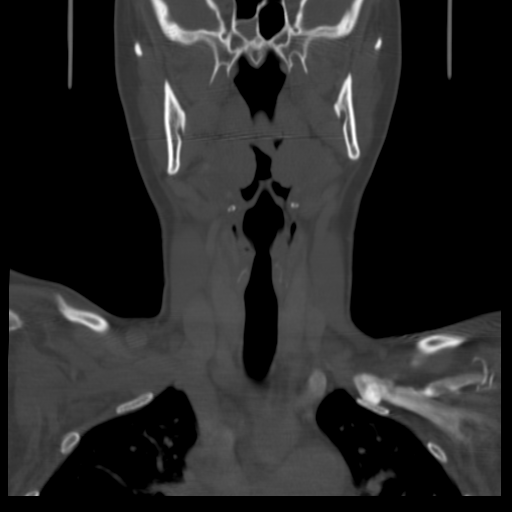
[im 59/98  bone]
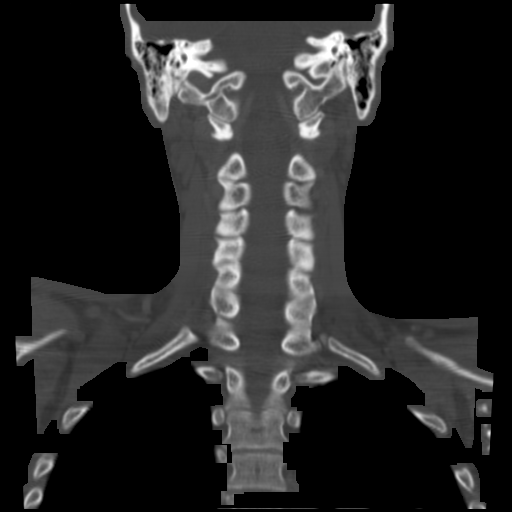

[Series 301: sag · sagittal · 0.51mm/px · 2 of 71 slices shown]
[im 24/71  bone]
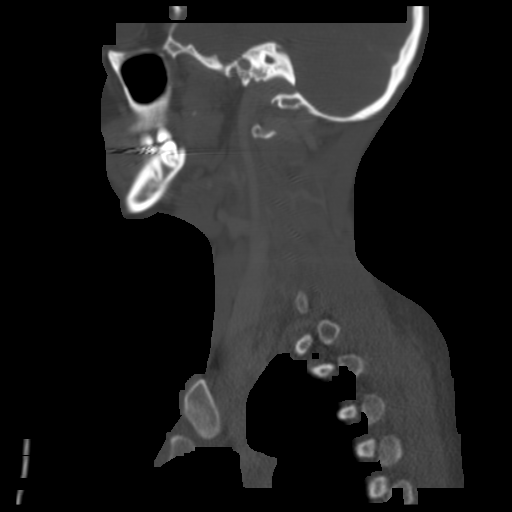
[im 47/71  bone]
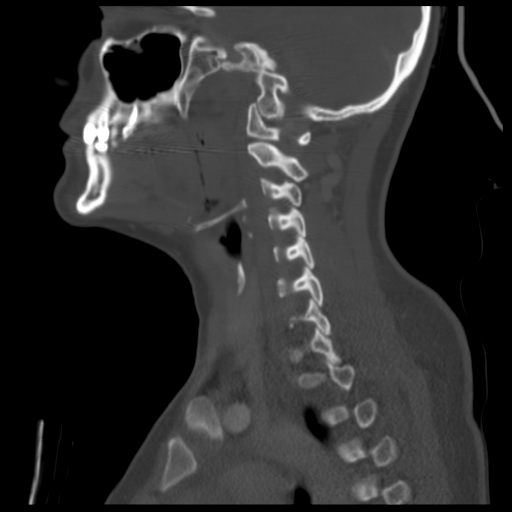

[10 of 20 positions shown; findings below may reference images not displayed]

FINDINGS: Symmetric tonsillar and adenoid hypertrophy is noted.  No
peritonsillar abscess.  No retropharyngeal fluid or abscess.
Parapharyngeal and sublingual spaces are within normal limits.
Submandibular and parotid glands are within normal limits.

Mild bilateral level II cervical lymphadenopathy is noted, with
nodes measuring up to 9 mm in short axis.  No necrotic lymph nodes.

Hypopharynx, larynx, thyroid, visualized superior mediastinum,
major vascular structures, visualized brain parenchyma, orbits, and
lung apices.

There is poor dentition in the bilateral mandibular bicuspids with
dental caries.  Scattered ethmoid and sphenoid sinus opacification
mucosal thickening.  Frontal sinuses are not included.  Maxillary
sinuses, tympanic cavities and mastoids are clear.
IMPRESSION: 1.  Palatine tonsillar hypertrophy without peritonsillar abscess or
fluid collection.  Adenoid hypertrophy and mild bilateral level II
lymphadenopathy.  Combined with mild paranasal sinus inflammatory
changes, favor acute upper respiratory tract infection.
2.  Extensive bilateral mandibular bicuspid dental caries.
Recommend dental follow up.

## 2011-05-01 IMAGING — CR DG ABDOMEN 2V
2 series · 2 of 2 positions shown · non-contrast
Comparison: None.

CLINICAL DATA: Pyelonephritis.  Vomiting and fever.  History of
sigmoid surgery.

ABDOMEN - 2 VIEW

[w abdomen upright]
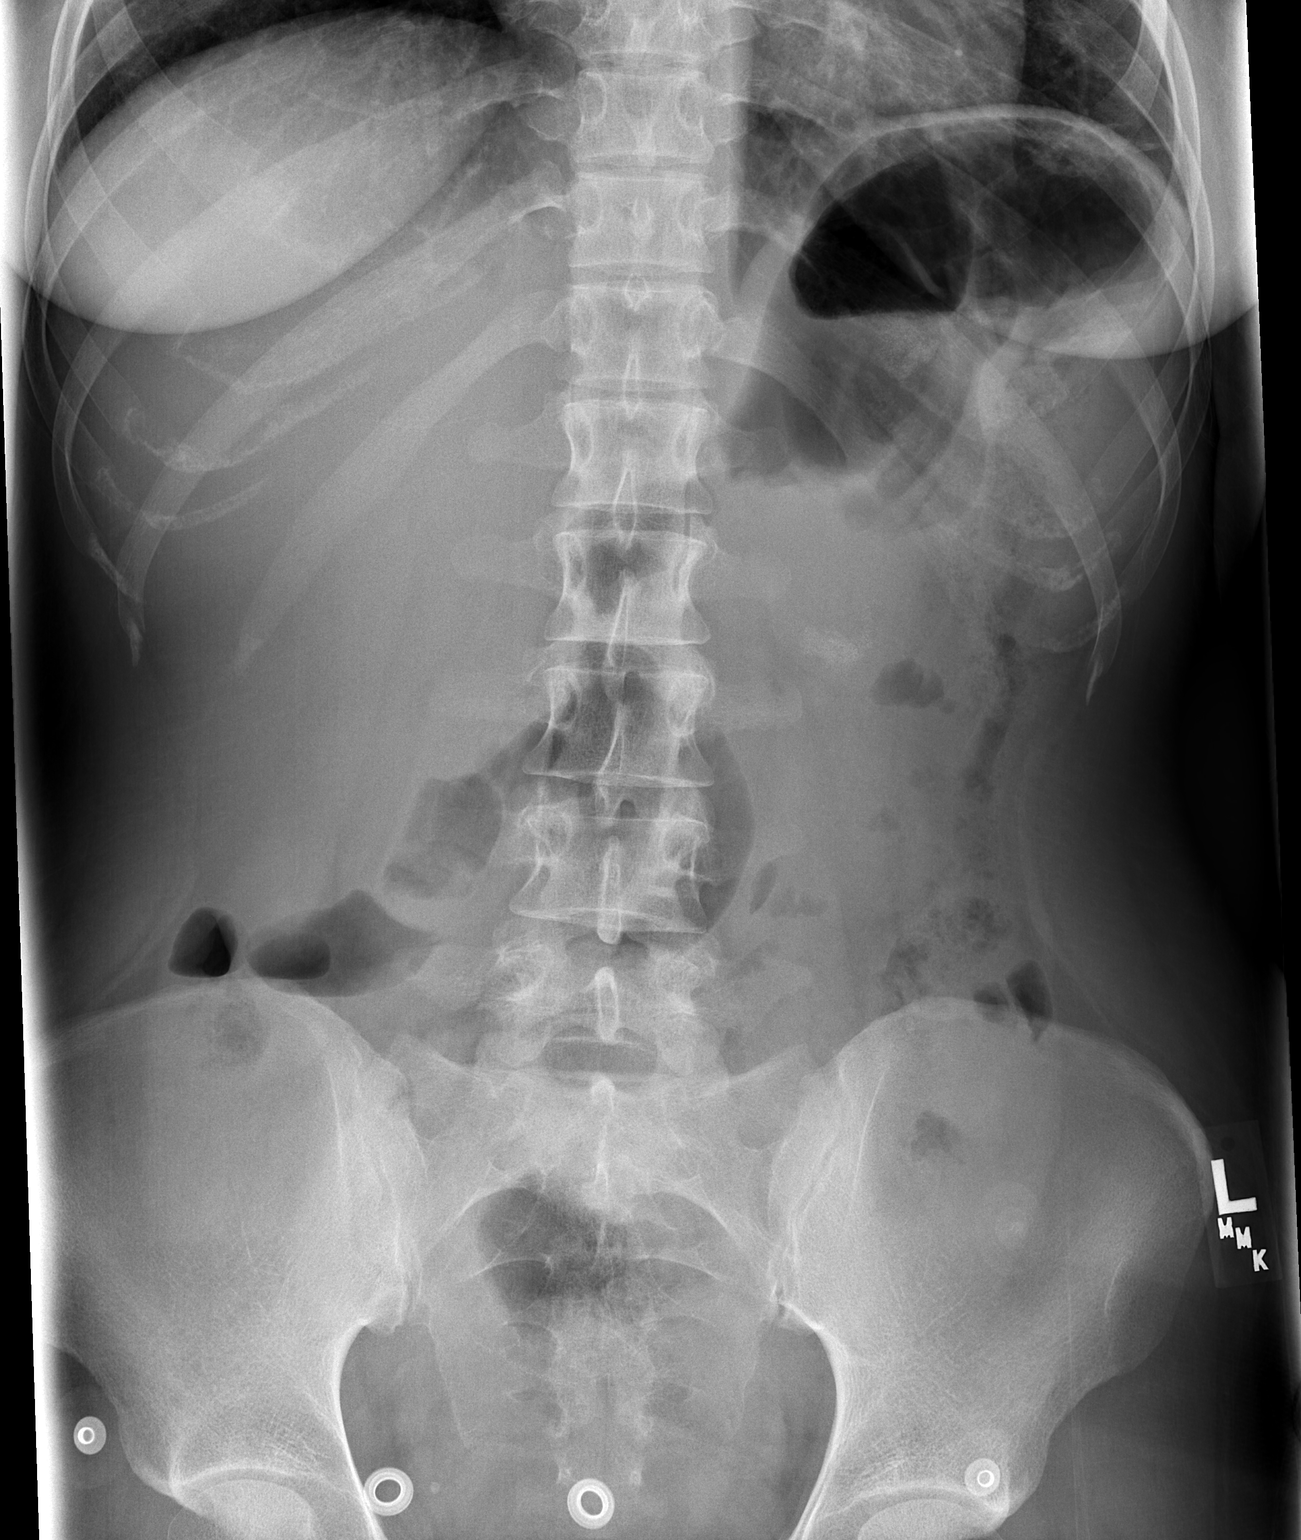

[t abdomen supine]
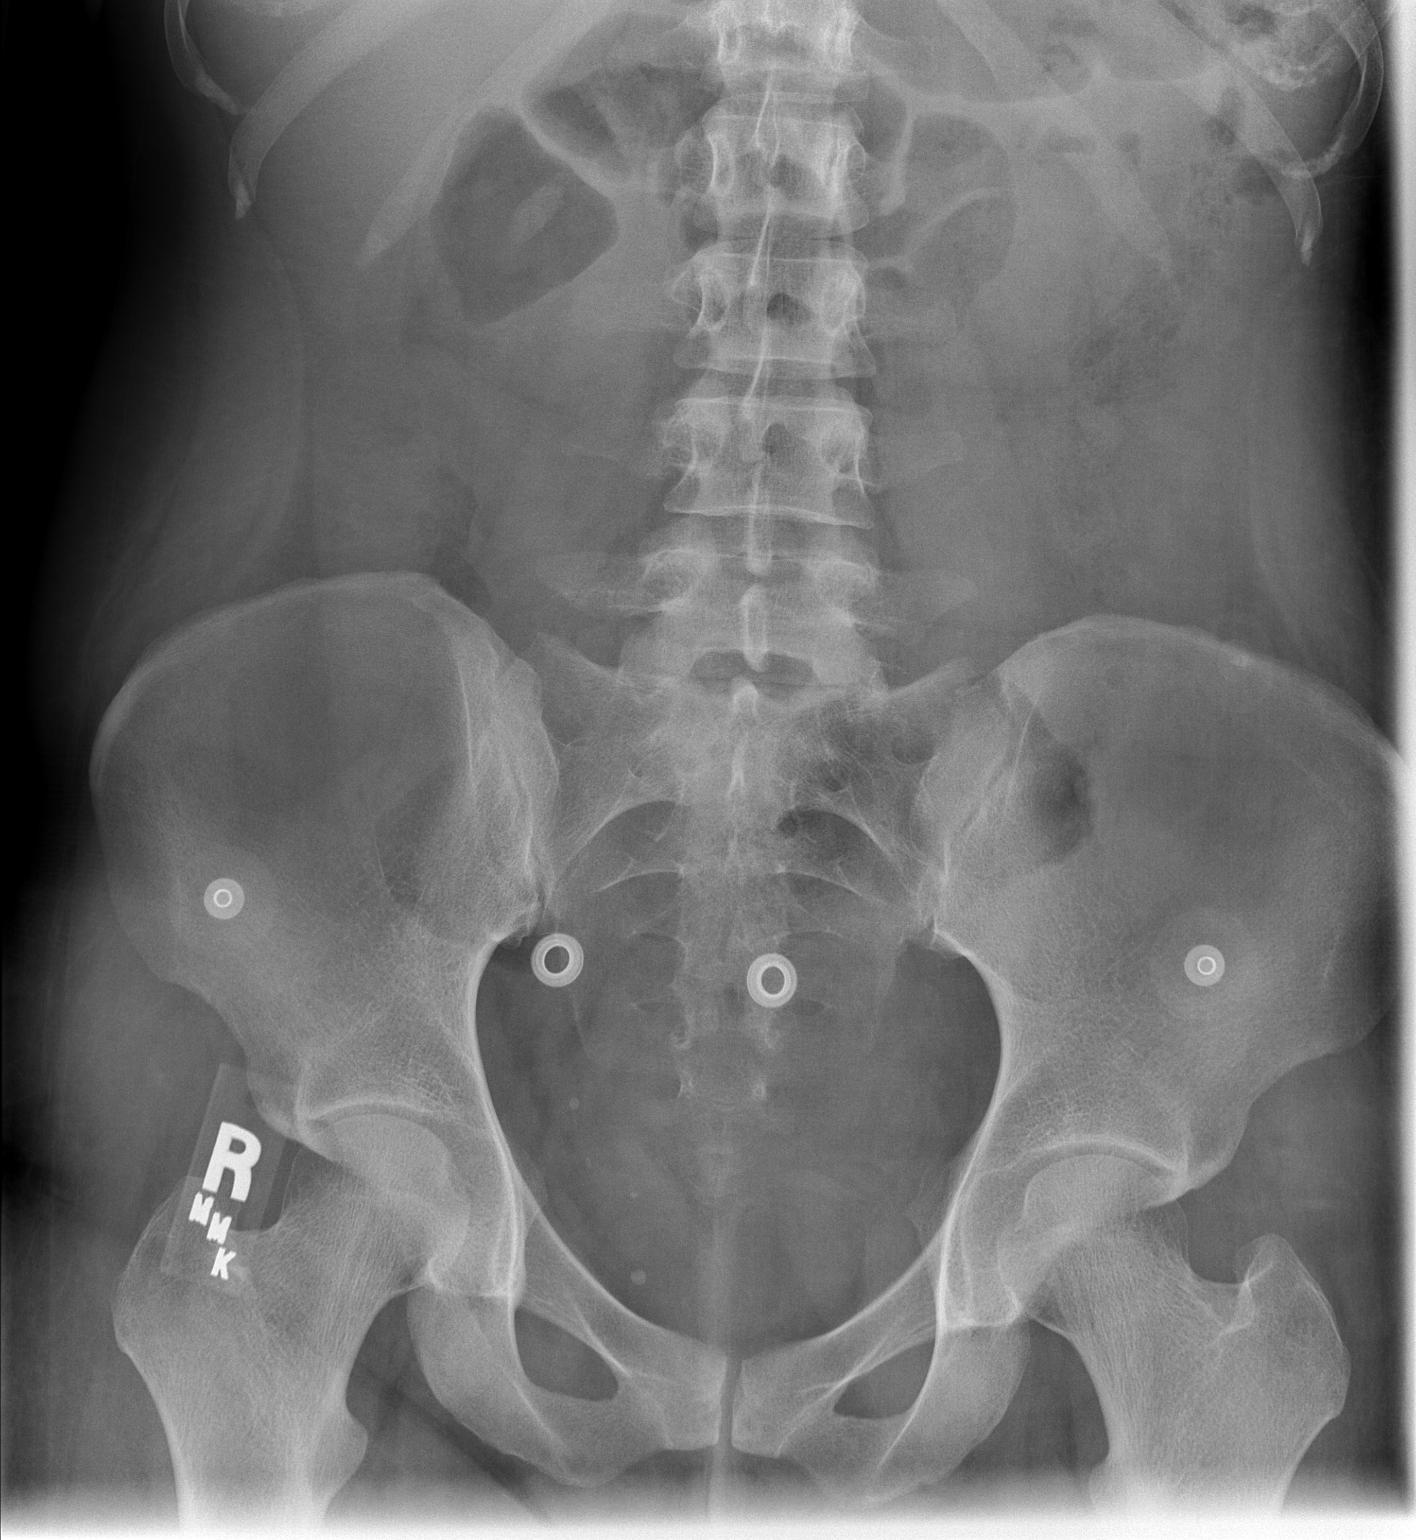

[2 of 2 positions shown; findings below may reference images not displayed]

FINDINGS: The bowel gas pattern is nonspecific.  There is no
evidence for obstruction or free air.  Gas and stool are seen
throughout the descending and sigmoid colon.
IMPRESSION: No acute abnormality.

## 2011-07-14 ENCOUNTER — Encounter: Payer: Self-pay | Admitting: Vascular Surgery

## 2011-07-15 ENCOUNTER — Ambulatory Visit: Payer: 59 | Admitting: Vascular Surgery

## 2012-11-12 ENCOUNTER — Telehealth: Payer: Self-pay | Admitting: Gastroenterology

## 2012-11-12 NOTE — Telephone Encounter (Signed)
[place] States that she saw Dr. Arlyce Dice in the past and was given a prescription for constipation. Pt was given amitiza. States she has been taking a dose of miralax each day but has a BM about every 7-10days. Pt wants to know if Dr. Arlyce Dice will prescribe something for constipation again. Please advise.

## 2012-11-16 MED ORDER — LUBIPROSTONE 24 MCG PO CAPS: 24.0000 ug | ORAL_CAPSULE | Freq: Two times a day (BID) | ORAL | Status: DC

## 2012-11-16 NOTE — Telephone Encounter (Signed)
OK to renew amitiza but give a 1 month supply.  She will need f/u with a local MD

## 2012-11-16 NOTE — Telephone Encounter (Signed)
Pt aware, script sent to the pharmacy. Pt requested an appt with Dr. Arlyce Dice, appt made.

## 2012-11-23 ENCOUNTER — Telehealth: Payer: Self-pay | Admitting: Gastroenterology

## 2012-11-23 NOTE — Telephone Encounter (Signed)
Pt states she is still having problems with constipation and would like to be seen sooner, states she is flying in today. Pt scheduled to see Dr. Arlyce Dice tomorrow at 1:30pm. Pt aware of appt date and time.

## 2012-11-24 ENCOUNTER — Encounter: Payer: Self-pay | Admitting: Gastroenterology

## 2012-11-24 ENCOUNTER — Ambulatory Visit (INDEPENDENT_AMBULATORY_CARE_PROVIDER_SITE_OTHER)
Admission: RE | Admit: 2012-11-24 | Discharge: 2012-11-24 | Disposition: A | Discharge status: Home / Self Care | Payer: 59 | Source: Ambulatory Visit | Attending: Gastroenterology | Admitting: Gastroenterology

## 2012-11-24 ENCOUNTER — Telehealth: Payer: Self-pay | Admitting: Gastroenterology

## 2012-11-24 ENCOUNTER — Ambulatory Visit (INDEPENDENT_AMBULATORY_CARE_PROVIDER_SITE_OTHER): Payer: 59 | Admitting: Gastroenterology

## 2012-11-24 VITALS — BP 118/70 | HR 80 | Ht 69.0 in | Wt 160.4 lb

## 2012-11-24 DIAGNOSIS — R109 Unspecified abdominal pain: Secondary | ICD-10-CM

## 2012-11-24 DIAGNOSIS — K769 Liver disease, unspecified: Secondary | ICD-10-CM

## 2012-11-24 MED ORDER — IOHEXOL 300 MG/ML  SOLN
100.0000 mL | Freq: Once | INTRAMUSCULAR | Status: AC | PRN
  Administered 2012-11-24: 100 mL via INTRAVENOUS

## 2012-11-24 NOTE — Assessment & Plan Note (Signed)
She has new-onset obstipation and constipation. Recurrent volvulus is unlikely although there is accumulation of gas and stool in the proximal descending colon and colon proximal to that point. Anastomotic stricture is a possibility.  Recommendations #1 CT of the abdomen and pelvis

## 2012-11-24 NOTE — Telephone Encounter (Signed)
See result note.  

## 2012-11-24 NOTE — Patient Instructions (Addendum)
You have been scheduled for a CT scan of the abdomen and pelvis at [facility] ([address]---this is in the same building as [facility]).   You are scheduled on 11/24/2012 at 4pm. You should arrive 15 minutes prior to your appointment time for registration. Please follow the written instructions below on the day of your exam:  WARNING: IF YOU ARE ALLERGIC TO IODINE/X-RAY DYE, PLEASE NOTIFY RADIOLOGY IMMEDIATELY AT [phone]! YOU WILL BE GIVEN A 13 HOUR PREMEDICATION PREP.  1) Do not eat or drink anything after11am (4 hours prior to your test) 2) You have been given 2 bottles of oral contrast to drink. The solution may taste               better if refrigerated, but do NOT add ice or any other liquid to this solution. Shake             well before drinking.    Drink 1 bottle of contrast @2pm  (2 hours prior to your exam)  Drink 1 bottle of contrast @ 3pm (1 hour prior to your exam)  You may take any medications as prescribed with a small amount of water except for the following: Metformin, Glucophage, Glucovance, Avandamet, Riomet, Fortamet, Actoplus Met, Janumet, Glumetza or Metaglip. The above medications must be held the day of the exam AND 48 hours after the exam.  The purpose of you drinking the oral contrast is to aid in the visualization of your intestinal tract. The contrast solution may cause some diarrhea. Before your exam is started, you will be given a small amount of fluid to drink. Depending on your individual set of symptoms, you may also receive an intravenous injection of x-ray contrast/dye. Plan on being at [facility] for 30 minutes or long, depending on the type of exam you are having performed.  This test typically takes 30-45 minutes to complete.  If you have any questions regarding your exam or if you need to reschedule, you may call the CT department at [phone] between the hours of 8:00 am and 5:00 pm,  Monday-Friday.  ________________________________________________________________________

## 2012-11-24 NOTE — Progress Notes (Signed)
History of Present Illness: 27 year old white female with history of sigmoid volvulus, status post resection, here for evaluation of constipation. She's having normal bowel movements until approximately 3 weeks ago when she became obstipated. He initially responded to Senokot. In the past week she's had nausea with vomiting and abdominal pain; she's been unsuccessful at moving her bowels. She complains of abdominal bloating as well. Films of the abdomen, which I reviewed, demonstrated a paucity of gas in the pelvis. There were no air fluid levels but there was a moderate amount of gas in the colon proximal to the splenic flexure. She's been passing only liquid stools. In the past she has responded to miralax. She took this again without response. She underwent decompression of the sigmoid volvulus in 2009 followed by resection,.  In 2012 she had a normal colonoscopy.    Past Medical History  Diagnosis Date  . Anemia    Past Surgical History  Procedure Laterality Date  . Sigmoid volvulus     family history includes Colon cancer in her maternal grandmother; Lung cancer in her maternal grandfather; and Prostate cancer in her paternal grandfather. Current Outpatient Prescriptions  Medication Sig Dispense Refill  . ciprofloxacin (CIPRO) 500 MG tablet Take 500 mg by mouth 2 (two) times daily.       Current Facility-Administered Medications  Medication Dose Route Frequency Provider Last Rate Last Dose  . 0.9 %  sodium chloride infusion  500 mL Intravenous Continuous Louis Meckel, MD       Allergies as of 11/24/2012  . (No Known Allergies)    reports that she has quit smoking. Her smoking use included Cigarettes. She smoked 0.00 packs per day. She has never used smokeless tobacco. She reports that she drinks about 1.2 ounces of alcohol per week. She reports that she does not use illicit drugs.     Review of Systems: Pertinent positive and negative review of systems were noted in the above HPI  section. All other review of systems were otherwise negative.  Vital signs were reviewed in today's medical record Physical Exam: General: Well developed , well nourished, no acute distress Skin: anicteric Head: Normocephalic and atraumatic Eyes:  sclerae anicteric, EOMI Ears: Normal auditory acuity Mouth: No deformity or lesions Neck: Supple, no masses or thyromegaly Lungs: Clear throughout to auscultation Heart: Regular rate and rhythm; no murmurs, rubs or bruits Abdomen: She has decreased bowel sounds. Abdomen is very slightly distended. There is moderate tenderness to palpation in the left lower quadrant greater than right lower quadrant with minimal rebound Rectal:deferred Musculoskeletal: Symmetrical with no gross deformities  Skin: No lesions on visible extremities Pulses:  Normal pulses noted Extremities: No clubbing, cyanosis, edema or deformities noted Neurological: Alert oriented x 4, grossly nonfocal Cervical Nodes:  No significant cervical adenopathy Inguinal Nodes: No significant inguinal adenopathy Psychological:  Alert and cooperative. Normal mood and affect

## 2012-11-25 ENCOUNTER — Other Ambulatory Visit (INDEPENDENT_AMBULATORY_CARE_PROVIDER_SITE_OTHER): Payer: 59

## 2012-11-25 DIAGNOSIS — K769 Liver disease, unspecified: Secondary | ICD-10-CM

## 2012-11-25 DIAGNOSIS — K7689 Other specified diseases of liver: Secondary | ICD-10-CM

## 2012-11-25 LAB — HEPATIC FUNCTION PANEL
ALT: 15 U/L (ref 0–35)
Albumin: 3.9 g/dL (ref 3.5–5.2)
Total Protein: 6.7 g/dL (ref 6.0–8.3)

## 2012-11-26 ENCOUNTER — Telehealth: Payer: Self-pay | Admitting: Gastroenterology

## 2012-11-26 NOTE — Telephone Encounter (Signed)
Pt calling for lab results. Let pt know that Dr. Arlyce Dice is not in the office and will not return until Monday. Will call her with results then.

## 2012-11-29 ENCOUNTER — Telehealth: Payer: Self-pay | Admitting: Gastroenterology

## 2012-11-29 NOTE — Telephone Encounter (Signed)
Pt. is calling for lab results. Please advise. 

## 2012-12-02 ENCOUNTER — Telehealth: Payer: Self-pay | Admitting: Gastroenterology

## 2012-12-02 NOTE — Telephone Encounter (Signed)
Pt. is calling for lab results. Please advise. 

## 2012-12-03 NOTE — Telephone Encounter (Signed)
Labs normal.  No further w/u

## 2012-12-03 NOTE — Telephone Encounter (Signed)
Informed pt Dr Arlyce Dice states her labs are normal and no further work up is needed; pt stated understanding.

## 2012-12-06 ENCOUNTER — Ambulatory Visit: Payer: 59 | Admitting: Gastroenterology

## 2013-11-03 ENCOUNTER — Ambulatory Visit: Payer: Self-pay | Admitting: Women's Health

## 2014-06-20 ENCOUNTER — Encounter (HOSPITAL_BASED_OUTPATIENT_CLINIC_OR_DEPARTMENT_OTHER): Payer: Self-pay | Admitting: Emergency Medicine

## 2014-06-20 ENCOUNTER — Emergency Department (HOSPITAL_BASED_OUTPATIENT_CLINIC_OR_DEPARTMENT_OTHER)
Admission: EM | Admit: 2014-06-20 | Discharge: 2014-06-20 | Disposition: A | Discharge status: Home / Self Care | Payer: 59 | Attending: Emergency Medicine | Admitting: Emergency Medicine

## 2014-06-20 DIAGNOSIS — G4489 Other headache syndrome: Secondary | ICD-10-CM | POA: Diagnosis not present

## 2014-06-20 DIAGNOSIS — Z792 Long term (current) use of antibiotics: Secondary | ICD-10-CM | POA: Insufficient documentation

## 2014-06-20 DIAGNOSIS — Z3202 Encounter for pregnancy test, result negative: Secondary | ICD-10-CM | POA: Insufficient documentation

## 2014-06-20 DIAGNOSIS — R51 Headache: Secondary | ICD-10-CM | POA: Diagnosis present

## 2014-06-20 DIAGNOSIS — Z862 Personal history of diseases of the blood and blood-forming organs and certain disorders involving the immune mechanism: Secondary | ICD-10-CM | POA: Diagnosis not present

## 2014-06-20 DIAGNOSIS — Z8659 Personal history of other mental and behavioral disorders: Secondary | ICD-10-CM | POA: Diagnosis not present

## 2014-06-20 DIAGNOSIS — Z87891 Personal history of nicotine dependence: Secondary | ICD-10-CM | POA: Diagnosis not present

## 2014-06-20 HISTORY — DX: Anxiety disorder, unspecified: F41.9

## 2014-06-20 LAB — URINALYSIS, ROUTINE W REFLEX MICROSCOPIC
BILIRUBIN URINE: NEGATIVE
Glucose, UA: NEGATIVE mg/dL
Hgb urine dipstick: NEGATIVE
Ketones, ur: NEGATIVE mg/dL
Leukocytes, UA: NEGATIVE
Nitrite: NEGATIVE
Protein, ur: NEGATIVE mg/dL
Specific Gravity, Urine: 1.009 (ref 1.005–1.030)
UROBILINOGEN UA: 1 mg/dL (ref 0.0–1.0)
pH: 7.5 (ref 5.0–8.0)

## 2014-06-20 LAB — BASIC METABOLIC PANEL
Anion gap: 5 (ref 5–15)
BUN: 10 mg/dL (ref 6–23)
CHLORIDE: 106 meq/L (ref 96–112)
CO2: 26 mmol/L (ref 19–32)
Calcium: 9.2 mg/dL (ref 8.4–10.5)
Creatinine, Ser: 0.65 mg/dL (ref 0.50–1.10)
GFR calc non Af Amer: >90 mL/min (ref >90)
Glucose, Bld: 99 mg/dL (ref 70–99)
Potassium: 3.6 mmol/L (ref 3.5–5.1)
Sodium: 137 mmol/L (ref 135–145)

## 2014-06-20 LAB — CBC
HCT: 35.8 % — ABNORMAL LOW (ref 36.0–46.0)
Hemoglobin: 11.6 g/dL — ABNORMAL LOW (ref 12.0–15.0)
MCH: 29.8 pg (ref 26.0–34.0)
MCHC: 32.4 g/dL (ref 30.0–36.0)
MCV: 92 fL (ref 78.0–100.0)
PLATELETS: 207 10*3/uL (ref 150–400)
RBC: 3.89 MIL/uL (ref 3.87–5.11)
RDW: 13.5 % (ref 11.5–15.5)
WBC: 14.1 10*3/uL — AB (ref 4.0–10.5)

## 2014-06-20 LAB — PREGNANCY, URINE: Preg Test, Ur: NEGATIVE

## 2014-06-20 MED ORDER — KETOROLAC TROMETHAMINE 30 MG/ML IJ SOLN
30.0000 mg | Freq: Once | INTRAMUSCULAR | Status: AC
  Administered 2014-06-20: 30 mg via INTRAVENOUS
  Filled 2014-06-20: qty 1

## 2014-06-20 MED ORDER — METOCLOPRAMIDE HCL 5 MG/ML IJ SOLN
10.0000 mg | Freq: Once | INTRAMUSCULAR | Status: AC
  Administered 2014-06-20: 10 mg via INTRAVENOUS
  Filled 2014-06-20: qty 2

## 2014-06-20 MED ORDER — DIPHENHYDRAMINE HCL 50 MG/ML IJ SOLN
25.0000 mg | Freq: Once | INTRAMUSCULAR | Status: AC
  Administered 2014-06-20: 25 mg via INTRAVENOUS
  Filled 2014-06-20: qty 1

## 2014-06-20 MED ORDER — SODIUM CHLORIDE 0.9 % IV BOLUS (SEPSIS)
1000.0000 mL | Freq: Once | INTRAVENOUS | Status: AC
  Administered 2014-06-20: 1000 mL via INTRAVENOUS

## 2014-06-20 MED ORDER — LORAZEPAM 0.5 MG PO TABS: 0.5000 mg | ORAL_TABLET | Freq: Three times a day (TID) | ORAL | Status: AC | PRN

## 2014-06-20 NOTE — Discharge Instructions (Signed)
Call for a follow up appointment with a Family or Primary Care Provider in [place] for further evaluation of your headache.  Eat a well balanced diet and drink plenty of fluids. Return if Symptoms worsen.   Take medication as prescribed.

## 2014-06-20 NOTE — ED Notes (Addendum)
29 yo with c/o headache and feeling faint x2 weeks. Denies LOC but has felt like she could fall over. Heaviness in the head with sensitivity to light. Has nausea but no v/d. Alert Oriented x3.

## 2014-06-20 NOTE — ED Provider Notes (Signed)
CSN: [id number]     Arrival date & time 06/20/14  1721 History   First MD Initiated Contact with Patient 06/20/14 1730     Chief Complaint  Patient presents with  . Headache  . Near Syncope     (Consider location/radiation/quality/duration/timing/severity/associated sxs/prior Treatment) HPI Comments: The patient is a 29 year old female with past history of anxiety, anemia presenting to emergency room chief complaint of intermittent headache for 2 weeks. Patient reports discomfort in the vertex and bilateral temples. She reports symptoms worsen when around people. She also reports associated lightheadedness, worsened after exercise and with people. She also reports feeling of syncope when she is thinking hard about something, feels as though she is walking on air, and feels like she is going to fall because she can't feel her legs. She denies syncope. Denies visual disturbances. Patient reports relief with ibuprofen or Aleve Patient's mother reports family death, 2.5 weeks ago and that it was "hard on the patient". Patient then proceeded to ask about a possible "clot in her head" or a tumor. Denies history of cancers.  Patient's mother reports related symptoms to grieving and stress. Patient's last menstrual period was 06/06/2014. NO PCP    Patient is a 29 y.o. female presenting with headaches and near-syncope. The history is provided by the patient. No language interpreter was used.  Headache Associated symptoms: near-syncope   Associated symptoms: no fever   Near Syncope Associated symptoms include headaches. Pertinent negatives include no chills or fever.    Past Medical History  Diagnosis Date  . Anemia   . Anxiety    Past Surgical History  Procedure Laterality Date  . Sigmoid volvulus     Family History  Problem Relation Age of Onset  . Lung cancer Maternal Grandfather   . Colon cancer Maternal Grandmother   . Prostate cancer Paternal Grandfather    History  Substance Use  Topics  . Smoking status: Former Smoker    Types: Cigarettes  . Smokeless tobacco: Never Used     Comment: 1 cig. occasionally  . Alcohol Use: 1.2 oz/week    2 Glasses of wine per week     Comment: occasionally   OB History    No data available     Review of Systems  Constitutional: Negative for fever and chills.  Cardiovascular: Positive for near-syncope.  Neurological: Positive for headaches.      Allergies  Review of patient's allergies indicates no known allergies.  Home Medications   Prior to Admission medications   Medication Sig Start Date End Date Taking? Authorizing Provider  ciprofloxacin (CIPRO) 500 MG tablet Take 500 mg by mouth 2 (two) times daily.    Historical Provider, MD   BP 112/67 mmHg  Pulse 76  Temp(Src) 98.4 F (36.9 C) (Oral)  Resp 18  Ht  (1.778 m)  Wt 175 lb (79.379 kg)  BMI 25.11 kg/m2  SpO2 100%  LMP 06/06/2014 Physical Exam  Constitutional: She is oriented to person, place, and time. She appears well-developed and well-nourished. No distress.  HENT:  Head: Normocephalic and atraumatic.  Eyes: EOM are normal. Pupils are equal, round, and reactive to light.  Neck: Normal range of motion. Neck supple.  Cardiovascular: Normal rate and regular rhythm.   Pulmonary/Chest: Effort normal and breath sounds normal. She has no wheezes. She has no rales.  Abdominal: Soft. There is no tenderness. There is no rebound and no guarding.  Musculoskeletal: Normal range of motion.  Neurological: She is alert and oriented  to person, place, and time.  Speech is clear and goal oriented, follows commands II-Visual fields were normal.   III/IV/VI-Pupils were equal and reacted. Extraocular movements were full and conjugate.  V/VII-no facial droop.   VIII-normal.   Motor: Strength 5/5 to upper and lower extremities bilaterally. Moves all 4 extremities equally. Sensory: normal sensation to upper and lower extremities.  Cerebellar: Normal finger to nose  bilaterally No pronator drift.  Skin: Skin is warm and dry. She is not diaphoretic.  Psychiatric: She has a normal mood and affect. Her behavior is normal.  Pt tearful after mother discussed family member's death.  Nursing note and vitals reviewed.   ED Course  Procedures (including critical care time) Labs Review Labs Reviewed  URINALYSIS, ROUTINE W REFLEX MICROSCOPIC - Abnormal; Notable for the following:    APPearance CLOUDY (*)    All other components within normal limits  CBC - Abnormal; Notable for the following:    WBC 14.1 (*)    Hemoglobin 11.6 (*)    HCT 35.8 (*)    All other components within normal limits  PREGNANCY, URINE  BASIC METABOLIC PANEL    Imaging Review No results found.   EKG Interpretation None      MDM   Final diagnoses:  Headache syndrome  History of anxiety   Patient presents with intermittent headache for 2 weeks, no meningismus, afebrile, no neurologic deficits on exam. Likely symptoms due to grieving process. No history of cancer, doubt intercranial process at this time. Reevaluation patient resting in room denies relief with Toradol, Reglan Benadryl ordered. Labs without concerning abnormalities, negative urine pregnancy, negative UA for infection. CBC shows leukocytosis at 14.1, hemoglobin 11.6, stable.  1955  reevaluation patient resting comfortably in room reports moderate resolution of symptoms, requesting be discharged home. Discussed return precautions and following up with a primary care doctor at home. Patient is requesting an anxiety medication, will prescribe a short course of medication, encourage patient to follow-up for further monitoring and evaluation of headache and stress/anxiety.  Meds given in ED:  Medications  sodium chloride 0.9 % bolus 1,000 mL (0 mLs Intravenous Stopped 06/20/14 2100)  ketorolac (TORADOL) 30 MG/ML injection 30 mg (30 mg Intravenous Given 06/20/14 1846)  metoCLOPramide (REGLAN) injection 10 mg (10 mg  Intravenous Given 06/20/14 1940)  diphenhydrAMINE (BENADRYL) injection 25 mg (25 mg Intravenous Given 06/20/14 1940)    New Prescriptions   LORAZEPAM (ATIVAN) 0.5 MG TABLET    Take 1 tablet (0.5 mg total) by mouth 3 (three) times daily as needed for anxiety.      Mellody DrownLauren Dearius Hoffmann, PA-C 06/20/14 2107  Mirian MoMatthew Gentry, MD 06/23/14 [phone]

## 2015-09-05 ENCOUNTER — Ambulatory Visit: Payer: Self-pay | Admitting: Women's Health

## 2015-09-20 ENCOUNTER — Ambulatory Visit: Payer: Self-pay | Admitting: Women's Health

## 2015-09-20 DIAGNOSIS — Z0289 Encounter for other administrative examinations: Secondary | ICD-10-CM
